# Patient Record
Sex: Female | Born: 1985 | Race: Black or African American | Hispanic: No | Marital: Single | Smoking: Never smoker
Health system: Southern US, Community
[De-identification: ages and names within clinical notes are randomized; demographics above are authoritative.]

## PROBLEM LIST (undated history)

## (undated) DIAGNOSIS — D219 Benign neoplasm of connective and other soft tissue, unspecified: Secondary | ICD-10-CM

## (undated) DIAGNOSIS — N200 Calculus of kidney: Secondary | ICD-10-CM

## (undated) HISTORY — PX: HERNIA REPAIR: SHX51

---

## 2009-08-09 ENCOUNTER — Ambulatory Visit (HOSPITAL_COMMUNITY): Admission: RE | Admit: 2009-08-09 | Discharge: 2009-08-09 | Payer: Self-pay | Admitting: Obstetrics

## 2009-08-09 IMAGING — US US OB DETAIL+14 WK
1 series · 14 of 28 positions shown · non-contrast
Comparison: none

OBSTETRICAL ULTRASOUND:
 This ultrasound exam was performed in the [HOSPITAL] Ultrasound Department.  The OB US report was generated in the AS system, and faxed to the ordering physician.  This report is also available in [HOSPITAL]?s Access[employer] and in [REDACTED] PACS.

[Series 1: us ob detail +14 wk · 0.30mm/px · 68 acquisitions, 14 frames shown]
[im 3/68]
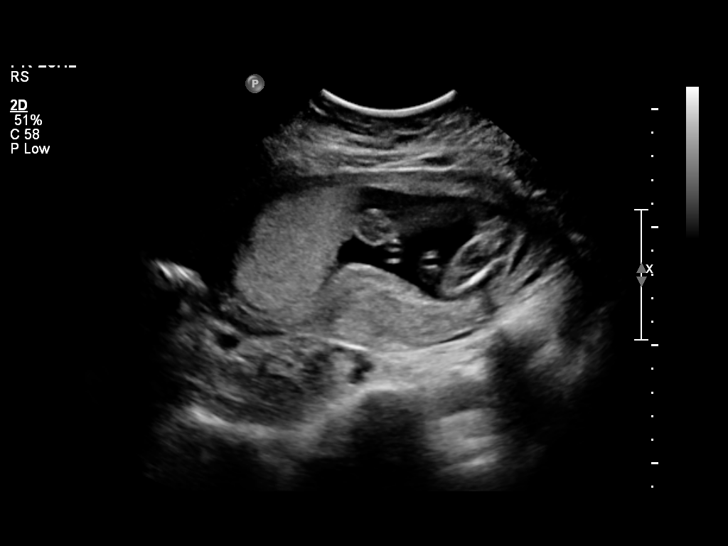
[im 8/68]
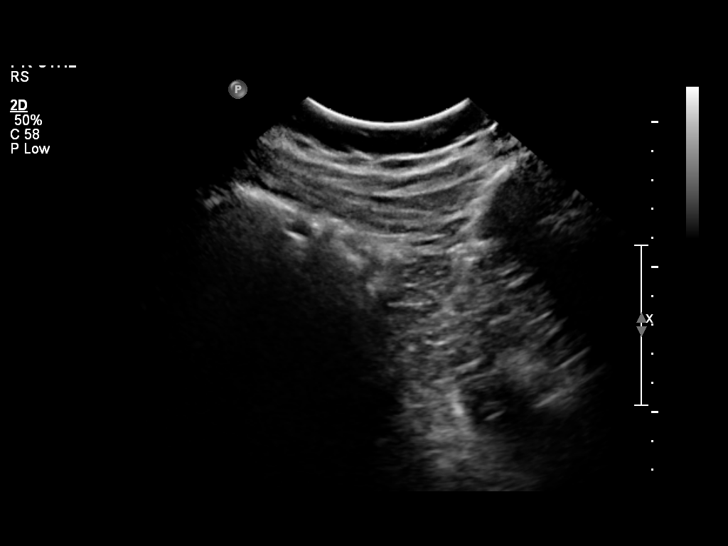
[im 13/68]
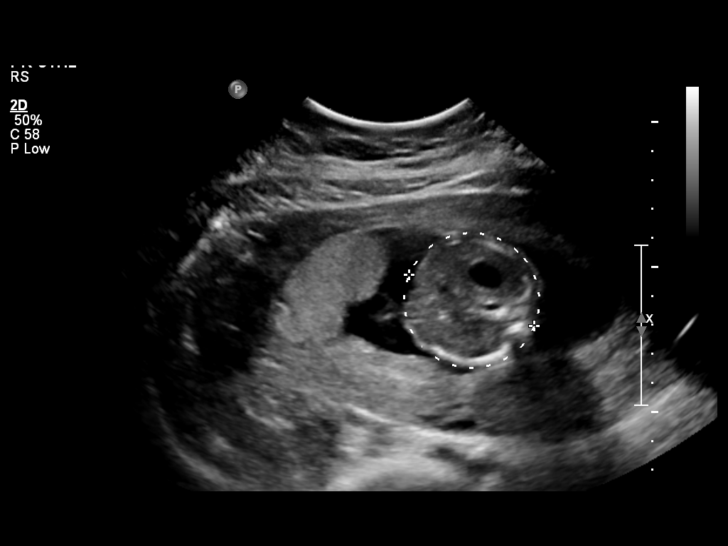
[im 18/68]
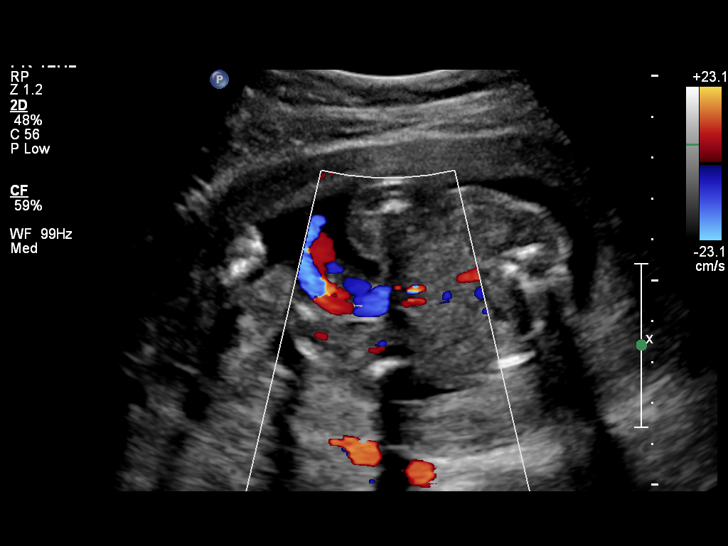
[im 23/68]
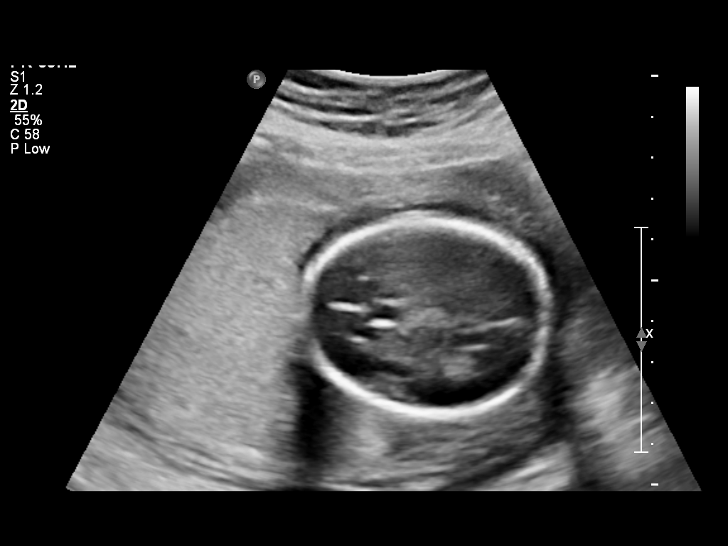
[im 28/68]
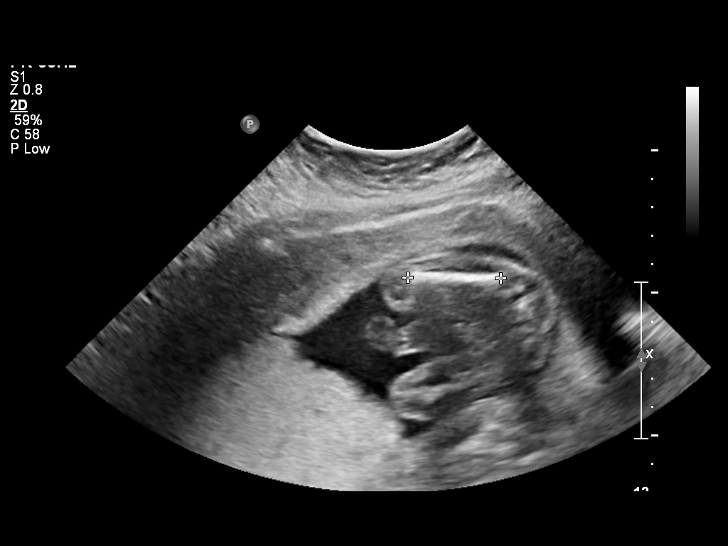
[im 33/68]
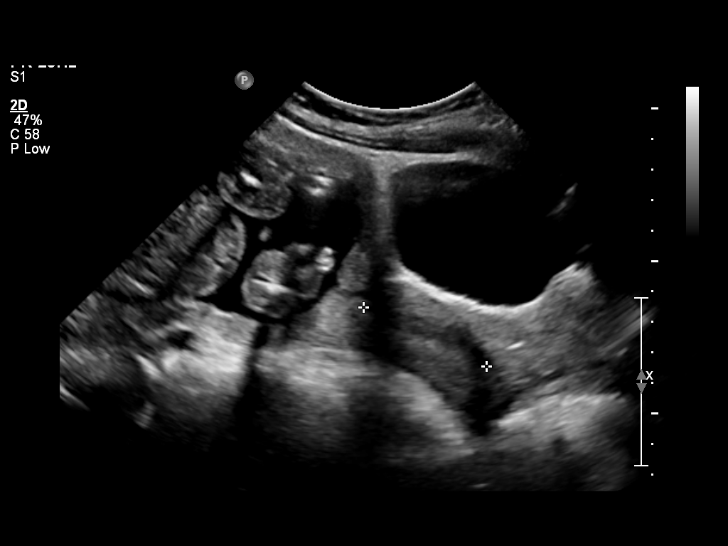
[im 38/68]
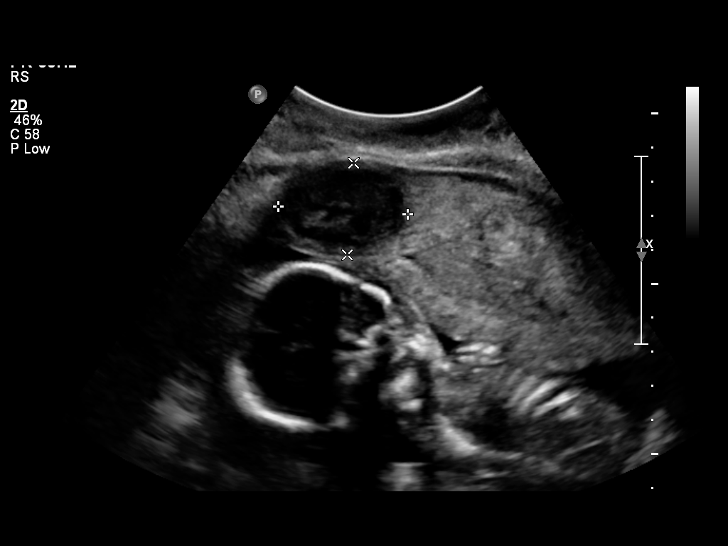
[im 43/68]
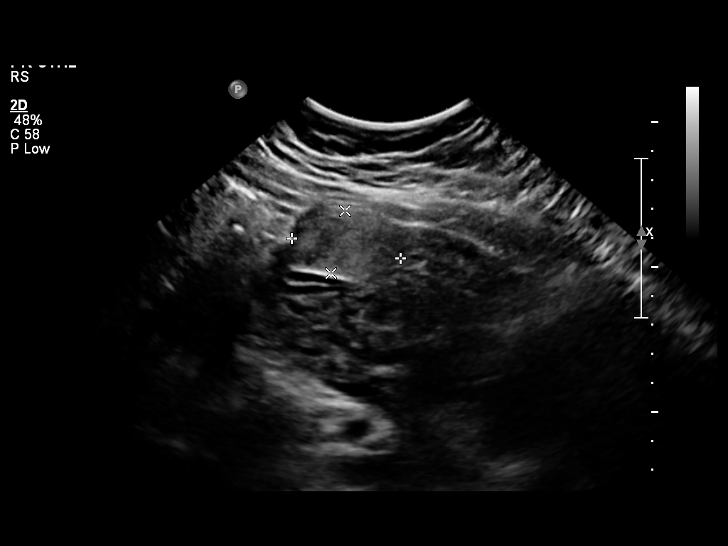
[im 48/68]
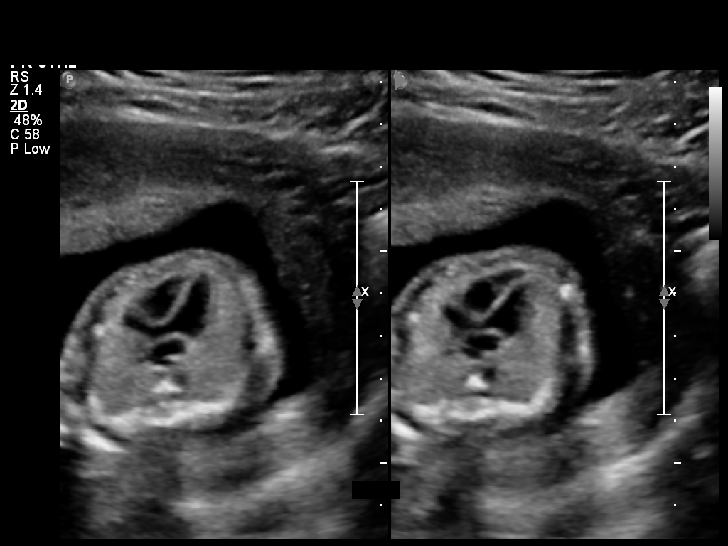
[im 53/68]
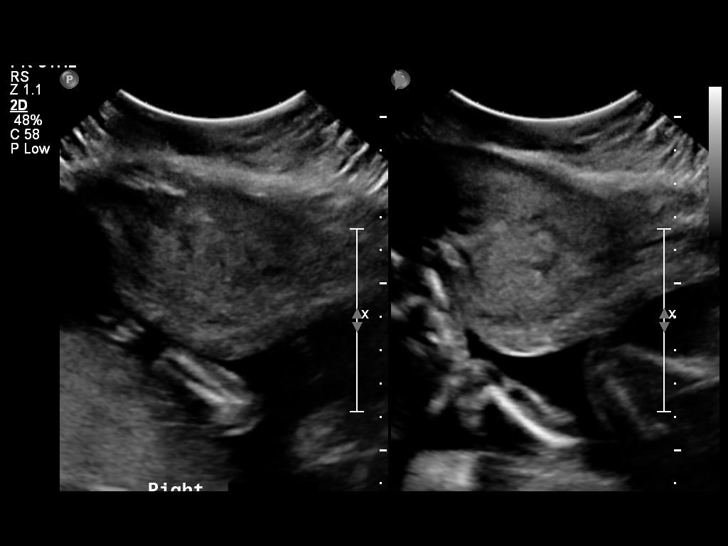
[im 58/68]
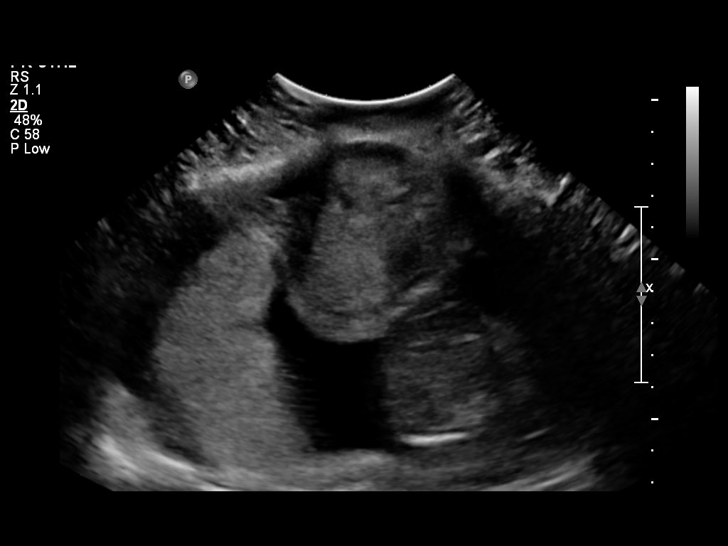
[im 63/68]
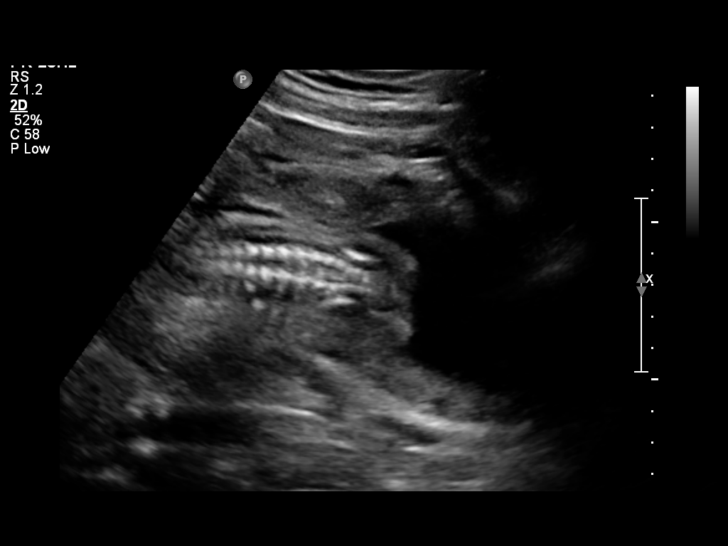
[im 68/68]
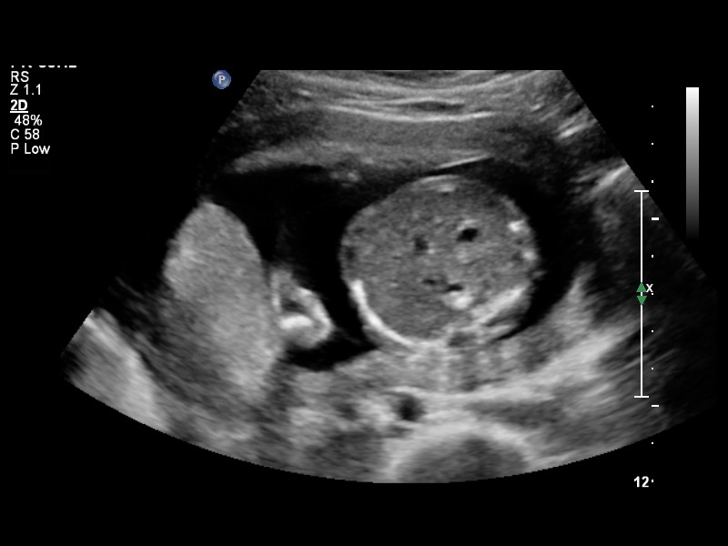

[14 of 28 positions shown; findings below may reference images not displayed]

IMPRESSION: See AS Obstetric US report.

## 2009-10-17 ENCOUNTER — Inpatient Hospital Stay (HOSPITAL_COMMUNITY): Admission: AD | Admit: 2009-10-17 | Discharge: 2009-10-17 | Payer: Self-pay | Admitting: Obstetrics

## 2009-12-08 ENCOUNTER — Ambulatory Visit: Payer: Self-pay | Admitting: Nurse Practitioner

## 2009-12-08 ENCOUNTER — Inpatient Hospital Stay (HOSPITAL_COMMUNITY): Admission: AD | Admit: 2009-12-08 | Discharge: 2009-12-08 | Payer: Self-pay | Admitting: Obstetrics

## 2009-12-13 ENCOUNTER — Encounter: Payer: Self-pay | Admitting: Obstetrics

## 2009-12-13 ENCOUNTER — Inpatient Hospital Stay (HOSPITAL_COMMUNITY): Admission: AD | Admit: 2009-12-13 | Discharge: 2009-12-15 | Payer: Self-pay | Admitting: Obstetrics

## 2010-01-16 ENCOUNTER — Encounter: Admission: RE | Admit: 2010-01-16 | Discharge: 2010-02-15 | Payer: Self-pay | Admitting: Obstetrics

## 2010-02-16 ENCOUNTER — Encounter: Admission: RE | Admit: 2010-02-16 | Discharge: 2010-03-04 | Payer: Self-pay | Admitting: Obstetrics

## 2010-03-19 ENCOUNTER — Encounter: Admission: RE | Admit: 2010-03-19 | Discharge: 2010-04-18 | Payer: Self-pay | Admitting: Obstetrics

## 2010-08-31 LAB — URIC ACID
Uric Acid, Serum: 6.2 mg/dL (ref 2.4–7.0)
Uric Acid, Serum: 5.1 mg/dL (ref 2.4–7.0)

## 2010-08-31 LAB — LACTATE DEHYDROGENASE
LDH: 177 U/L (ref 94–250)
LDH: 162 U/L (ref 94–250)

## 2010-08-31 LAB — CBC
Hemoglobin: 13.9 g/dL (ref 12.0–15.0)
MCH: 33.2 pg (ref 26.0–34.0)
MCH: 33.9 pg (ref 26.0–34.0)
MCH: 33.4 pg (ref 26.0–34.0)
Platelets: 146 10*3/uL — ABNORMAL LOW (ref 150–400)
Platelets: 157 10*3/uL (ref 150–400)
RBC: 4.19 MIL/uL (ref 3.87–5.11)
RBC: 4.13 MIL/uL (ref 3.87–5.11)
RDW: 12.8 % (ref 11.5–15.5)
RDW: 12.9 % (ref 11.5–15.5)
WBC: 10.6 10*3/uL — ABNORMAL HIGH (ref 4.0–10.5)

## 2010-08-31 LAB — COMPREHENSIVE METABOLIC PANEL
ALT: 18 U/L (ref 0–35)
AST: 23 U/L (ref 0–37)
Alkaline Phosphatase: 211 U/L — ABNORMAL HIGH (ref 39–117)
Alkaline Phosphatase: 202 U/L — ABNORMAL HIGH (ref 39–117)
CO2: 20 mEq/L (ref 19–32)
CO2: 24 mEq/L (ref 19–32)
Calcium: 9.2 mg/dL (ref 8.4–10.5)
Chloride: 107 mEq/L (ref 96–112)
GFR calc Af Amer: >60 mL/min (ref >60)
GFR calc non Af Amer: >60 mL/min (ref >60)
GFR calc non Af Amer: >60 mL/min (ref >60)
Glucose, Bld: 84 mg/dL (ref 70–99)
Potassium: 3.8 mEq/L (ref 3.5–5.1)
Potassium: 4 mEq/L (ref 3.5–5.1)
Total Bilirubin: 0.6 mg/dL (ref 0.3–1.2)
Total Bilirubin: 0.4 mg/dL (ref 0.3–1.2)

## 2010-08-31 LAB — URINALYSIS, ROUTINE W REFLEX MICROSCOPIC
Bilirubin Urine: NEGATIVE
Nitrite: NEGATIVE
Specific Gravity, Urine: >1.03 — ABNORMAL HIGH (ref 1.005–1.030)

## 2010-08-31 LAB — RPR: RPR Ser Ql: NONREACTIVE

## 2010-08-31 LAB — URINE MICROSCOPIC-ADD ON

## 2010-09-02 LAB — URINALYSIS, ROUTINE W REFLEX MICROSCOPIC
Hgb urine dipstick: NEGATIVE
Ketones, ur: NEGATIVE mg/dL
Specific Gravity, Urine: 1.02 (ref 1.005–1.030)

## 2010-09-02 LAB — WET PREP, GENITAL
Clue Cells Wet Prep HPF POC: NONE SEEN
Trich, Wet Prep: NONE SEEN
Yeast Wet Prep HPF POC: NONE SEEN

## 2011-11-27 ENCOUNTER — Encounter (INDEPENDENT_AMBULATORY_CARE_PROVIDER_SITE_OTHER): Payer: Self-pay | Admitting: Surgery

## 2011-12-03 ENCOUNTER — Ambulatory Visit (INDEPENDENT_AMBULATORY_CARE_PROVIDER_SITE_OTHER): Payer: BC Managed Care – PPO | Admitting: Surgery

## 2011-12-03 ENCOUNTER — Encounter (INDEPENDENT_AMBULATORY_CARE_PROVIDER_SITE_OTHER): Payer: Self-pay | Admitting: Surgery

## 2011-12-03 VITALS — BP 118/76 | HR 68 | Temp 97.6°F | Resp 16 | Ht 60.0 in | Wt 162.2 lb

## 2011-12-03 DIAGNOSIS — K429 Umbilical hernia without obstruction or gangrene: Secondary | ICD-10-CM

## 2011-12-03 NOTE — Progress Notes (Signed)
Patient ID: Carrie Campos, female   DOB: [birth year], 26 y.o.   MRN: [id number]  Chief Complaint  Patient presents with  . Umbilical Hernia    Evaluate umbilical hernia    HPI Carrie Campos is a 26 y.o. female.  Referred by Dr. Henderson Cloud for evaluation of supraumbilical hernia HPI This is a 26 year old female who presents with a history of her last several months of a palpable mass above her umbilicus. This has enlarged slightly. It causes some discomfort as well as some nausea. This area has never gone away completely. She was evaluated by Dr. Henderson Cloud who felt that she might have a supraumbilical hernia. She is now referred for surgical evaluation. No past medical history on file.  Past Surgical History  Procedure Date  . Hernia repair     hernia repair as a baby    No family history on file.  Social History History  Substance Use Topics  . Smoking status: Never Smoker   . Smokeless tobacco: Not on file  . Alcohol Use: No    No Known Allergies  Current Outpatient Prescriptions  Medication Sig Dispense Refill  . Bismuth Subsalicylate (PEPTO-BISMOL PO) Take by mouth.      . naproxen sodium (ANAPROX) 220 MG tablet Take 220 mg by mouth 2 (two) times daily with a meal.        Review of Systems Review of Systems  Constitutional: Negative for fever, chills and unexpected weight change.  HENT: Negative for hearing loss, congestion, sore throat, trouble swallowing and voice change.   Eyes: Negative for visual disturbance.  Respiratory: Negative for cough and wheezing.   Cardiovascular: Negative for chest pain, palpitations and leg swelling.  Gastrointestinal: Positive for nausea and abdominal pain. Negative for vomiting, diarrhea, constipation, blood in stool, abdominal distention and anal bleeding.  Genitourinary: Negative for hematuria, vaginal bleeding and difficulty urinating.  Musculoskeletal: Negative for arthralgias.  Skin: Negative for rash and wound.  Neurological:  Negative for seizures, syncope and headaches.  Hematological: Negative for adenopathy. Does not bruise/bleed easily.  Psychiatric/Behavioral: Negative for confusion.    Blood pressure 118/76, pulse 68, temperature 97.6 F (36.4 C), temperature source Temporal, resp. rate 16, height 5' (1.524 m), weight 162 lb 3.2 oz (73.573 kg).  Physical Exam Physical Exam WDWN in NAD HEENT:  EOMI, sclera anicteric Neck:  No masses, no thyromegaly Lungs:  CTA bilaterally; normal respiratory effort CV:  Regular rate and rhythm; no murmurs Abd:  +bowel sounds, soft, palpable 4 cm mass above the umbilicus; enlarges with Valsalva; does not completely reduce Ext:  Well-perfused; no edema Skin:  Warm, dry; no sign of jaundice  Data Reviewed None  Assessment    Supraumbilical hernia    Plan    Recommend umbilical hernia repair with mesh.  The surgical procedure has been discussed with the patient.  Potential risks, benefits, alternative treatments, and expected outcomes have been explained.  All of the patient's questions at this time have been answered.  The likelihood of reaching the patient's treatment goal is good.  The patient understand the proposed surgical procedure and wishes to proceed.She is quite nervous about having surgery, but I reassured her that this was necessary to avoid incarceration/ strangulation/ enlarging hernia which might require a larger surgery.        Jake Goodson K. 12/03/2011, 10:25 AM

## 2011-12-08 ENCOUNTER — Telehealth (INDEPENDENT_AMBULATORY_CARE_PROVIDER_SITE_OTHER): Payer: Self-pay | Admitting: General Surgery

## 2011-12-08 NOTE — Telephone Encounter (Signed)
Pt calling to ask if surgery will go forward if she is having her period that day.  Reassured her that it will, but to alert the nursing staff of it when she checks in that day.  She understands.

## 2011-12-24 ENCOUNTER — Encounter (HOSPITAL_COMMUNITY): Payer: Self-pay | Admitting: *Deleted

## 2012-01-01 ENCOUNTER — Encounter (HOSPITAL_COMMUNITY): Payer: Self-pay

## 2012-01-04 ENCOUNTER — Encounter (HOSPITAL_COMMUNITY): Payer: Self-pay | Admitting: Anesthesiology

## 2012-01-04 ENCOUNTER — Ambulatory Visit (HOSPITAL_COMMUNITY): Payer: BC Managed Care – PPO | Admitting: Anesthesiology

## 2012-01-04 ENCOUNTER — Encounter (HOSPITAL_COMMUNITY)
Admission: RE | Disposition: A | Discharge status: Home / Self Care | Payer: Self-pay | Source: Ambulatory Visit | Attending: Surgery

## 2012-01-04 ENCOUNTER — Ambulatory Visit (HOSPITAL_COMMUNITY)
Admission: RE | Admit: 2012-01-04 | Discharge: 2012-01-04 | Disposition: A | Discharge status: Home / Self Care | Payer: BC Managed Care – PPO | Source: Ambulatory Visit | Attending: Surgery | Admitting: Surgery

## 2012-01-04 ENCOUNTER — Encounter (HOSPITAL_COMMUNITY): Payer: Self-pay | Admitting: *Deleted

## 2012-01-04 DIAGNOSIS — Z791 Long term (current) use of non-steroidal anti-inflammatories (NSAID): Secondary | ICD-10-CM | POA: Insufficient documentation

## 2012-01-04 DIAGNOSIS — K429 Umbilical hernia without obstruction or gangrene: Secondary | ICD-10-CM

## 2012-01-04 HISTORY — DX: Benign neoplasm of connective and other soft tissue, unspecified: D21.9

## 2012-01-04 HISTORY — PX: UMBILICAL HERNIA REPAIR: SHX196

## 2012-01-04 HISTORY — DX: Calculus of kidney: N20.0

## 2012-01-04 LAB — SURGICAL PCR SCREEN
MRSA, PCR: POSITIVE — AB
Staphylococcus aureus: POSITIVE — AB

## 2012-01-04 LAB — CBC
HCT: 36.6 % (ref 36.0–46.0)
Hemoglobin: 12.2 g/dL (ref 12.0–15.0)
MCV: 89.1 fL (ref 78.0–100.0)
RDW: 12.4 % (ref 11.5–15.5)
WBC: 5.5 10*3/uL (ref 4.0–10.5)

## 2012-01-04 SURGERY — REPAIR, HERNIA, UMBILICAL, ADULT
Anesthesia: General | Site: Abdomen | Wound class: Clean

## 2012-01-04 MED ORDER — PROMETHAZINE HCL 25 MG/ML IJ SOLN: 6.2500 mg | INTRAMUSCULAR | Status: DC | PRN

## 2012-01-04 MED ORDER — KETOROLAC TROMETHAMINE 30 MG/ML IJ SOLN
30.0000 mg | Freq: Once | INTRAMUSCULAR | Status: AC
  Administered 2012-01-04: 30 mg via INTRAVENOUS

## 2012-01-04 MED ORDER — DEXAMETHASONE SODIUM PHOSPHATE 4 MG/ML IJ SOLN
INTRAMUSCULAR | Status: DC | PRN
  Administered 2012-01-04: 4 mg via INTRAVENOUS

## 2012-01-04 MED ORDER — HYDROMORPHONE HCL PF 1 MG/ML IJ SOLN
0.2500 mg | INTRAMUSCULAR | Status: DC | PRN
  Administered 2012-01-04 (×4): 0.5 mg via INTRAVENOUS

## 2012-01-04 MED ORDER — 0.9 % SODIUM CHLORIDE (POUR BTL) OPTIME
TOPICAL | Status: DC | PRN
  Administered 2012-01-04: 1000 mL

## 2012-01-04 MED ORDER — FENTANYL CITRATE 0.05 MG/ML IJ SOLN
INTRAMUSCULAR | Status: DC | PRN
  Administered 2012-01-04: 25 ug via INTRAVENOUS
  Administered 2012-01-04 (×2): 50 ug via INTRAVENOUS
  Administered 2012-01-04: 25 ug via INTRAVENOUS
  Administered 2012-01-04: 100 ug via INTRAVENOUS

## 2012-01-04 MED ORDER — CHLORHEXIDINE GLUCONATE 4 % EX LIQD
1.0000 "application " | Freq: Once | CUTANEOUS | Status: DC
  Filled 2012-01-04: qty 15

## 2012-01-04 MED ORDER — BUPIVACAINE-EPINEPHRINE PF 0.25-1:200000 % IJ SOLN
INTRAMUSCULAR | Status: AC
  Filled 2012-01-04: qty 30

## 2012-01-04 MED ORDER — METOCLOPRAMIDE HCL 5 MG/ML IJ SOLN
INTRAMUSCULAR | Status: DC | PRN
  Administered 2012-01-04: 10 mg via INTRAVENOUS

## 2012-01-04 MED ORDER — BUPIVACAINE-EPINEPHRINE 0.25% -1:200000 IJ SOLN
INTRAMUSCULAR | Status: DC | PRN
  Administered 2012-01-04: 10 mL

## 2012-01-04 MED ORDER — OXYCODONE-ACETAMINOPHEN 5-325 MG PO TABS: 1.0000 | ORAL_TABLET | ORAL | Status: AC | PRN

## 2012-01-04 MED ORDER — ONDANSETRON HCL 4 MG/2ML IJ SOLN: 4.0000 mg | INTRAMUSCULAR | Status: DC | PRN

## 2012-01-04 MED ORDER — OXYCODONE-ACETAMINOPHEN 5-325 MG PO TABS
ORAL_TABLET | ORAL | Status: AC
  Administered 2012-01-04: 1 via ORAL
  Filled 2012-01-04: qty 1

## 2012-01-04 MED ORDER — MIDAZOLAM HCL 5 MG/5ML IJ SOLN
INTRAMUSCULAR | Status: DC | PRN
  Administered 2012-01-04: 2 mg via INTRAVENOUS

## 2012-01-04 MED ORDER — LACTATED RINGERS IV SOLN
INTRAVENOUS | Status: DC
  Administered 2012-01-04: 09:00:00 via INTRAVENOUS

## 2012-01-04 MED ORDER — MUPIROCIN 2 % EX OINT
TOPICAL_OINTMENT | CUTANEOUS | Status: AC
  Filled 2012-01-04: qty 22

## 2012-01-04 MED ORDER — CEFAZOLIN SODIUM-DEXTROSE 2-3 GM-% IV SOLR
INTRAVENOUS | Status: AC
  Filled 2012-01-04: qty 50

## 2012-01-04 MED ORDER — KETOROLAC TROMETHAMINE 30 MG/ML IJ SOLN
INTRAMUSCULAR | Status: AC
  Filled 2012-01-04: qty 1

## 2012-01-04 MED ORDER — PROPOFOL 10 MG/ML IV BOLUS
INTRAVENOUS | Status: DC | PRN
  Administered 2012-01-04: 200 mg via INTRAVENOUS

## 2012-01-04 MED ORDER — OXYCODONE-ACETAMINOPHEN 5-325 MG PO TABS
1.0000 | ORAL_TABLET | ORAL | Status: DC | PRN
  Administered 2012-01-04: 1 via ORAL

## 2012-01-04 MED ORDER — CEFAZOLIN SODIUM-DEXTROSE 2-3 GM-% IV SOLR
2.0000 g | Freq: Once | INTRAVENOUS | Status: AC
  Administered 2012-01-04: 2 g via INTRAVENOUS

## 2012-01-04 MED ORDER — HYDROMORPHONE HCL PF 1 MG/ML IJ SOLN
INTRAMUSCULAR | Status: AC
  Filled 2012-01-04: qty 1

## 2012-01-04 MED ORDER — MORPHINE SULFATE 10 MG/ML IJ SOLN: 2.0000 mg | INTRAMUSCULAR | Status: DC | PRN

## 2012-01-04 MED ORDER — LIDOCAINE HCL 1 % IJ SOLN
INTRAMUSCULAR | Status: DC | PRN
  Administered 2012-01-04: 30 mg via INTRADERMAL

## 2012-01-04 MED ORDER — FENTANYL CITRATE 0.05 MG/ML IJ SOLN
INTRAMUSCULAR | Status: AC
  Filled 2012-01-04: qty 2

## 2012-01-04 MED ORDER — ONDANSETRON HCL 4 MG/2ML IJ SOLN
INTRAMUSCULAR | Status: DC | PRN
  Administered 2012-01-04: 4 mg via INTRAVENOUS

## 2012-01-04 SURGICAL SUPPLY — 41 items
BENZOIN TINCTURE PRP APPL 2/3 (GAUZE/BANDAGES/DRESSINGS) ×2 IMPLANT
BLADE HEX COATED 2.75 (ELECTRODE) ×2 IMPLANT
BLADE SURG 15 STRL LF DISP TIS (BLADE) ×1 IMPLANT
BLADE SURG 15 STRL SS (BLADE) ×1
CLOTH BEACON ORANGE TIMEOUT ST (SAFETY) ×2 IMPLANT
DECANTER SPIKE VIAL GLASS SM (MISCELLANEOUS) IMPLANT
DRAPE LAPAROTOMY T 102X78X121 (DRAPES) ×2 IMPLANT
DRAPE UTILITY XL STRL (DRAPES) ×2 IMPLANT
DRSG TEGADERM 4X4.75 (GAUZE/BANDAGES/DRESSINGS) IMPLANT
ELECT REM PT RETURN 9FT ADLT (ELECTROSURGICAL) ×2
ELECTRODE REM PT RTRN 9FT ADLT (ELECTROSURGICAL) ×1 IMPLANT
GAUZE SPONGE 4X4 16PLY XRAY LF (GAUZE/BANDAGES/DRESSINGS) ×2 IMPLANT
GLOVE BIO SURGEON STRL SZ7 (GLOVE) ×2 IMPLANT
GLOVE BIOGEL PI IND STRL 7.0 (GLOVE) IMPLANT
GLOVE BIOGEL PI IND STRL 7.5 (GLOVE) ×1 IMPLANT
GLOVE BIOGEL PI INDICATOR 7.0 (GLOVE)
GLOVE BIOGEL PI INDICATOR 7.5 (GLOVE) ×1
GOWN STRL NON-REIN LRG LVL3 (GOWN DISPOSABLE) ×4 IMPLANT
GOWN STRL REIN XL XLG (GOWN DISPOSABLE) ×2 IMPLANT
KIT BASIN OR (CUSTOM PROCEDURE TRAY) ×2 IMPLANT
NEEDLE HYPO 22GX1.5 SAFETY (NEEDLE) ×2 IMPLANT
NEEDLE HYPO 25X1 1.5 SAFETY (NEEDLE) IMPLANT
NS IRRIG 1000ML POUR BTL (IV SOLUTION) ×2 IMPLANT
PACK BASIC VI WITH GOWN DISP (CUSTOM PROCEDURE TRAY) ×2 IMPLANT
PATCH VENTRAL MEDIUM 6.4 (Mesh Specialty) ×2 IMPLANT
PENCIL BUTTON HOLSTER BLD 10FT (ELECTRODE) ×2 IMPLANT
SPONGE GAUZE 4X4 12PLY (GAUZE/BANDAGES/DRESSINGS) ×2 IMPLANT
SPONGE LAP 4X18 X RAY DECT (DISPOSABLE) ×4 IMPLANT
STRIP CLOSURE SKIN 1/2X4 (GAUZE/BANDAGES/DRESSINGS) ×2 IMPLANT
SUT MNCRL AB 4-0 PS2 18 (SUTURE) ×2 IMPLANT
SUT NOVA NAB DX-16 0-1 5-0 T12 (SUTURE) ×2 IMPLANT
SUT NOVA NAB GS-21 0 18 T12 DT (SUTURE) ×4 IMPLANT
SUT PROLENE 0 CT 1 CR/8 (SUTURE) IMPLANT
SUT PROLENE 0 CT 2 (SUTURE) IMPLANT
SUT VIC AB 3-0 SH 27 (SUTURE) ×1
SUT VIC AB 3-0 SH 27X BRD (SUTURE) IMPLANT
SUT VIC AB 3-0 SH 27XBRD (SUTURE) ×1 IMPLANT
SYR CONTROL 10ML LL (SYRINGE) ×2 IMPLANT
TAPE CLOTH SURG 4X10 WHT LF (GAUZE/BANDAGES/DRESSINGS) ×2 IMPLANT
TOWEL OR 17X26 10 PK STRL BLUE (TOWEL DISPOSABLE) ×2 IMPLANT
TOWEL OR NON WOVEN STRL DISP B (DISPOSABLE) ×2 IMPLANT

## 2012-01-04 NOTE — H&P (Signed)
Progress Notes     Patient ID: Carrie Campos, female   DOB: 07/31/1985, 26 y.o.   MRN: [id number]    Chief Complaint   Patient presents with   .  Umbilical Hernia       Evaluate umbilical hernia        HPI Carrie Campos is a 26 y.o. female.  Referred by Dr. Henderson Cloud for evaluation of supraumbilical hernia HPI This is a 26 year old female who presents with a history of her last several months of a palpable mass above her umbilicus. This has enlarged slightly. It causes some discomfort as well as some nausea. This area has never gone away completely. She was evaluated by Dr. Henderson Cloud who felt that she might have a supraumbilical hernia. She is now referred for surgical evaluation. No past medical history on file.    Past Surgical History   Procedure  Date   .  Hernia repair         hernia repair as a baby        No family history on file.   Social History History   Substance Use Topics   .  Smoking status:  Never Smoker    .  Smokeless tobacco:  Not on file   .  Alcohol Use:  No        No Known Allergies    Current Outpatient Prescriptions   Medication  Sig  Dispense  Refill   .  Bismuth Subsalicylate (PEPTO-BISMOL PO)  Take by mouth.         .  naproxen sodium (ANAPROX) 220 MG tablet  Take 220 mg by mouth 2 (two) times daily with a meal.              Review of Systems Review of Systems  Constitutional: Negative for fever, chills and unexpected weight change.  HENT: Negative for hearing loss, congestion, sore throat, trouble swallowing and voice change.   Eyes: Negative for visual disturbance.  Respiratory: Negative for cough and wheezing.   Cardiovascular: Negative for chest pain, palpitations and leg swelling.  Gastrointestinal: Positive for nausea and abdominal pain. Negative for vomiting, diarrhea, constipation, blood in stool, abdominal distention and anal bleeding.  Genitourinary: Negative for hematuria, vaginal bleeding and difficulty urinating.    Musculoskeletal: Negative for arthralgias.  Skin: Negative for rash and wound.  Neurological: Negative for seizures, syncope and headaches.  Hematological: Negative for adenopathy. Does not bruise/bleed easily.  Psychiatric/Behavioral: Negative for confusion.      Blood pressure 118/76, pulse 68, temperature 97.6 F (36.4 C), temperature source Temporal, resp. rate 16, height 5' (1.524 m), weight 162 lb 3.2 oz (73.573 kg).   Physical Exam Physical Exam WDWN in NAD HEENT:  EOMI, sclera anicteric Neck:  No masses, no thyromegaly Lungs:  CTA bilaterally; normal respiratory effort CV:  Regular rate and rhythm; no murmurs Abd:  +bowel sounds, soft, palpable 4 cm mass above the umbilicus; enlarges with Valsalva; does not completely reduce Ext:  Well-perfused; no edema Skin:  Warm, dry; no sign of jaundice   Data Reviewed None   Assessment    Supraumbilical hernia     Plan    Recommend umbilical hernia repair with mesh.  The surgical procedure has been discussed with the patient.  Potential risks, benefits, alternative treatments, and expected outcomes have been explained.  All of the patient's questions at this time have been answered.  The likelihood of reaching the patient's treatment goal is good.  The  patient understand the proposed surgical procedure and wishes to proceed.She is quite nervous about having surgery, but I reassured her that this was necessary to avoid incarceration/ strangulation/ enlarging hernia which might require a larger surgery.        Wilmon Arms. Corliss Skains, MD, [facility]  01/04/2012 7:19 AM

## 2012-01-04 NOTE — Interval H&P Note (Signed)
History and Physical Interval Note:  01/04/2012 7:19 AM  Carrie Campos  has presented today for surgery, with the diagnosis of umbilica hernia  The various methods of treatment have been discussed with the patient and family. After consideration of risks, benefits and other options for treatment, the patient has consented to  Procedure(s) (LRB): HERNIA REPAIR UMBILICAL ADULT (N/A) INSERTION OF MESH (N/A) as a surgical intervention .  The patient's history has been reviewed, patient examined, no change in status, stable for surgery.  I have reviewed the patient's chart and labs.  Questions were answered to the patient's satisfaction.     [name]

## 2012-01-04 NOTE — Anesthesia Preprocedure Evaluation (Signed)
Anesthesia Evaluation  Patient identified by MRN, date of birth, ID band Patient awake    Reviewed: Allergy & Precautions, H&P , NPO status , Patient's Chart, lab work & pertinent test results  Airway Mallampati: II TM Distance: >3 FB Neck ROM: Full    Dental  (+) Teeth Intact and Dental Advisory Given   Pulmonary neg pulmonary ROS,  breath sounds clear to auscultation  Pulmonary exam normal       Cardiovascular negative cardio ROS  Rhythm:Regular Rate:Normal     Neuro/Psych negative neurological ROS  negative psych ROS   GI/Hepatic negative GI ROS, Neg liver ROS,   Endo/Other  negative endocrine ROS  Renal/GU negative Renal ROS  negative genitourinary   Musculoskeletal negative musculoskeletal ROS (+)   Abdominal   Peds  Hematology negative hematology ROS (+)   Anesthesia Other Findings   Reproductive/Obstetrics negative OB ROS                           Anesthesia Physical Anesthesia Plan  ASA: I  Anesthesia Plan: General   Post-op Pain Management:    Induction: Intravenous  Airway Management Planned: Oral ETT  Additional Equipment:   Intra-op Plan:   Post-operative Plan: Extubation in OR  Informed Consent: I have reviewed the patients History and Physical, chart, labs and discussed the procedure including the risks, benefits and alternatives for the proposed anesthesia with the patient or authorized representative who has indicated his/her understanding and acceptance.   Dental advisory given  Plan Discussed with: CRNA  Anesthesia Plan Comments:         Anesthesia Quick Evaluation  

## 2012-01-04 NOTE — Anesthesia Postprocedure Evaluation (Signed)
Anesthesia Post Note  Patient: Carrie Campos  Procedure(s) Performed: Procedure(s) (LRB): HERNIA REPAIR UMBILICAL ADULT (N/A) INSERTION OF MESH (N/A)  Anesthesia type: General  Patient location: PACU  Post pain: Pain level controlled  Post assessment: Post-op Vital signs reviewed  Last Vitals:  Filed Vitals:   01/04/12 0904  BP: 125/60  Pulse: 74  Temp: 37.2 C  Resp: 16    Post vital signs: Reviewed  Level of consciousness: sedated  Complications: No apparent anesthesia complications

## 2012-01-04 NOTE — Op Note (Signed)
Indications:  The patient presented with a history of non-reducible umbilical hernia.  The patient was examined and we recommended umbilical hernia repair with mesh.  Pre-operative diagnosis:  Umbilical hernia  Post-operative diagnosis:  Same  Surgeon: Runell Kovich K.   Assistants: none  Anesthesia: General LMA anesthesia  ASA Class: 2   Procedure Details  The patient was seen again in the Holding Room. The risks, benefits, complications, treatment options, and expected outcomes were discussed with the patient. The possibilities of reaction to medication, pulmonary aspiration, perforation of viscus, bleeding, recurrent infection, the need for additional procedures, and development of a complication requiring transfusion or further operation were discussed with the patient and/or family. There was concurrence with the proposed plan, and informed consent was obtained. The site of surgery was properly noted/marked. The patient was taken to the Operating Room, identified as Carrie Campos, and the procedure verified as umbilical hernia repair. A Time Out was held and the above information confirmed.  After an adequate level of general anesthesia was obtained, the patient's abdomen was prepped with Chloraprep and draped in sterile fashion.  We made a transverse incision above the umbilicus.  Dissection was carried down to the hernia sac with cautery.  We dissected bluntly around the hernia sac down to the edge of the fascial defect.  The hernia sac was opened and contained only omentum.  The hernia sac was excised.  The fascial defect measured 1 cm.  I explored the fascial defect with a finger and palpated two other adjacent fascial defects.  We connected these into a single 3 cm defect.  We cleared the fascia in all directions.  A medium Proceed ventral patch was inserted into the pre-peritoneal space and was deployed.  The mesh was secured with eight trans-fascial sutures of 0 Novofil.  The fascial  defect was closed with multiple interrupted figure-of-eight 1 Novofil sutures.  The base of the umbilicus was tacked down with 3-0 Vicryl.  3-0 Vicryl was used to close the subcutaneous tissues and 4-0 Monocryl was used to close the skin.  Steri-strips and clean dressing were applied.  The patient was extubated and brought to the recovery room in stable condition.  All sponge, instrument, and needle counts were correct prior to closure and at the conclusion of the case.   Estimated Blood Loss: Minimal          Complications: None; patient tolerated the procedure well.         Disposition: PACU - hemodynamically stable.         Condition: stable  Wilmon Arms. Corliss Skains, MD, [facility]  01/04/2012 8:54 AM

## 2012-01-04 NOTE — Transfer of Care (Signed)
Immediate Anesthesia Transfer of Care Note  Patient: Carrie Campos  Procedure(s) Performed: Procedure(s) (LRB): HERNIA REPAIR UMBILICAL ADULT (N/A) INSERTION OF MESH (N/A)  Patient Location: PACU  Anesthesia Type: General  Level of Consciousness: awake, alert , oriented and patient cooperative  Airway & Oxygen Therapy: Patient Spontanous Breathing and Patient connected to face mask oxygen  Post-op Assessment: Report given to PACU RN and Post -op Vital signs reviewed and unstable, Anesthesiologist notified  Post vital signs: Reviewed and stable  Complications: No apparent anesthesia complications

## 2012-01-05 ENCOUNTER — Encounter (HOSPITAL_COMMUNITY): Payer: Self-pay | Admitting: Surgery

## 2012-01-22 ENCOUNTER — Encounter (INDEPENDENT_AMBULATORY_CARE_PROVIDER_SITE_OTHER): Payer: Self-pay | Admitting: Surgery

## 2012-01-22 ENCOUNTER — Ambulatory Visit (INDEPENDENT_AMBULATORY_CARE_PROVIDER_SITE_OTHER): Payer: BC Managed Care – PPO | Admitting: Surgery

## 2012-01-22 VITALS — BP 106/84 | HR 83 | Temp 98.3°F | Resp 16 | Ht 60.0 in | Wt 163.0 lb

## 2012-01-22 DIAGNOSIS — K429 Umbilical hernia without obstruction or gangrene: Secondary | ICD-10-CM

## 2012-01-22 NOTE — Progress Notes (Signed)
Status post umbilical hernia repair with mesh on 01/04/12. The patient had 3 small defects that were combined into one 3 cm defect. This was repaired with a medium proceed ventral patch. She is doing quite well. She still has a little bit of residual soreness. Her incision is well healed and barely visible. No sign of recurrent hernia. No sign of infection or hematoma. She may begin increasing her level of activity over the next few weeks. Resume full activity at the end of August. Followup p.r.n.  Wilmon Arms. Corliss Skains, MD, [facility]  01/22/2012 11:46 AM

## 2012-02-04 ENCOUNTER — Encounter (INDEPENDENT_AMBULATORY_CARE_PROVIDER_SITE_OTHER): Payer: BC Managed Care – PPO | Admitting: Surgery

## 2016-11-04 ENCOUNTER — Emergency Department (HOSPITAL_COMMUNITY)
Admission: EM | Admit: 2016-11-04 | Discharge: 2016-11-05 | Disposition: A | Discharge status: Home / Self Care | Payer: BLUE CROSS/BLUE SHIELD | Attending: Emergency Medicine | Admitting: Emergency Medicine

## 2016-11-04 ENCOUNTER — Encounter (HOSPITAL_COMMUNITY): Payer: Self-pay

## 2016-11-04 DIAGNOSIS — R55 Syncope and collapse: Secondary | ICD-10-CM | POA: Diagnosis not present

## 2016-11-04 DIAGNOSIS — R5383 Other fatigue: Secondary | ICD-10-CM | POA: Diagnosis present

## 2016-11-04 LAB — URINALYSIS, ROUTINE W REFLEX MICROSCOPIC
BILIRUBIN URINE: NEGATIVE
GLUCOSE, UA: NEGATIVE mg/dL
KETONES UR: NEGATIVE mg/dL
LEUKOCYTES UA: NEGATIVE
NITRITE: NEGATIVE
PH: 5 (ref 5.0–8.0)
Protein, ur: NEGATIVE mg/dL
Specific Gravity, Urine: 1.024 (ref 1.005–1.030)

## 2016-11-04 LAB — BASIC METABOLIC PANEL
Anion gap: 8 (ref 5–15)
BUN: 8 mg/dL (ref 6–20)
CALCIUM: 9.5 mg/dL (ref 8.9–10.3)
CO2: 19 mmol/L — ABNORMAL LOW (ref 22–32)
CREATININE: 0.75 mg/dL (ref 0.44–1.00)
Chloride: 109 mmol/L (ref 101–111)
GFR calc Af Amer: >60 mL/min (ref >60)
Glucose, Bld: 134 mg/dL — ABNORMAL HIGH (ref 65–99)
POTASSIUM: 3.6 mmol/L (ref 3.5–5.1)
SODIUM: 136 mmol/L (ref 135–145)

## 2016-11-04 LAB — CBC
HEMATOCRIT: 38.6 % (ref 36.0–46.0)
Hemoglobin: 12.6 g/dL (ref 12.0–15.0)
MCH: 29 pg (ref 26.0–34.0)
MCHC: 32.6 g/dL (ref 30.0–36.0)
MCV: 88.7 fL (ref 78.0–100.0)
PLATELETS: 224 10*3/uL (ref 150–400)
RBC: 4.35 MIL/uL (ref 3.87–5.11)
RDW: 13.9 % (ref 11.5–15.5)
WBC: 9.3 10*3/uL (ref 4.0–10.5)

## 2016-11-04 LAB — CBG MONITORING, ED: Glucose-Capillary: 121 mg/dL — ABNORMAL HIGH (ref 65–99)

## 2016-11-04 LAB — I-STAT BETA HCG BLOOD, ED (MC, WL, AP ONLY): I-stat hCG, quantitative: <5 m[IU]/mL (ref <5)

## 2016-11-04 MED ORDER — SODIUM CHLORIDE 0.9 % IV BOLUS (SEPSIS)
1000.0000 mL | Freq: Once | INTRAVENOUS | Status: AC
  Administered 2016-11-04: 1000 mL via INTRAVENOUS

## 2016-11-04 NOTE — ED Provider Notes (Signed)
[facility] DEPT Provider Note   CSN: [id number] Arrival date & time: 11/04/16  2012     History   Chief Complaint Chief Complaint  Patient presents with  . Fatigue    HPI Carrie Campos is a 31 y.o. female.  HPI   31 year old female with history of fibroid presenting with generalized weakness. Patient states for the past 1-2 weeks she has been feeling generalized fatigue and "Out of it". The symptom has gotten progressively worse. She denies any associated headache, fever, URI symptoms, chest pain, shortness of breath, productive cough, abdominal pain, back pain, nausea vomiting diarrhea, focal numbness or weakness. She was told that she has low iron early in the year was on iron supplementation. She was taken off iron supplements in April. She decided to resume taking her iron supplement within the past couple weeks but take it sparingly. She does admits having dark stool from taking iron but denies any abnormal bleeding. She admits that she is stressed out from work and having to take care of her kids. She denies fever and depressed, no SI or HI. She denies alcohol or drug abuse. No history of thyroid disease. No other medication changes. She takes Advil on occasion for occasional headache. No headache currently. She does not have a PCP. Her symptoms seems to be indolent. Denies any increased lightheadedness with positional change.  Past Medical History:  Diagnosis Date  . Fibroid   . Kidney stone   . Kidney stone     Patient Active Problem List   Diagnosis Date Noted  . Umbilical hernia 50/53/9767    Past Surgical History:  Procedure Laterality Date  . HERNIA REPAIR     hernia repair as a baby  . UMBILICAL HERNIA REPAIR  01/04/2012   Procedure: HERNIA REPAIR UMBILICAL ADULT;  Surgeon: Imogene Burn. Georgette Dover, MD;  Location: [facility] ORS;  Service: General;  Laterality: N/A;    OB History    No data available       Home Medications    Prior to Admission medications   Not on  File    Family History History reviewed. No pertinent family history.  Social History Social History  Substance Use Topics  . Smoking status: Never Smoker  . Smokeless tobacco: Not on file  . Alcohol use No     Allergies   Patient has no known allergies.   Review of Systems Review of Systems  All other systems reviewed and are negative.    Physical Exam Updated Vital Signs BP 131/78   Pulse (!) 114   Temp 99 F (37.2 C)   Resp (!) 22   Ht 5' (1.524 m)   Wt 81.6 kg (180 lb)   SpO2 100%   BMI 35.15 kg/m   Physical Exam  Constitutional: She is oriented to person, place, and time. She appears well-developed and well-nourished. No distress.  HENT:  Head: Atraumatic.  Eyes: Conjunctivae are normal.  Neck: Normal range of motion. Neck supple.  Cardiovascular: Normal rate, regular rhythm and intact distal pulses.   Pulmonary/Chest: Effort normal and breath sounds normal.  Abdominal: Bowel sounds are normal. She exhibits no distension. There is no tenderness.  Musculoskeletal: She exhibits no edema.  Neurological: She is alert and oriented to person, place, and time. She has normal strength. No cranial nerve deficit or sensory deficit. She displays a negative Romberg sign. Coordination and gait normal. GCS eye subscore is 4. GCS verbal subscore is 5. GCS motor subscore is 6.  Skin:  No rash noted.  Psychiatric: She has a normal mood and affect.  Nursing note and vitals reviewed.    ED Treatments / Results  Labs (all labs ordered are listed, but only abnormal results are displayed) Labs Reviewed  BASIC METABOLIC PANEL - Abnormal; Notable for the following:       Result Value   CO2 19 (*)    Glucose, Bld 134 (*)    All other components within normal limits  URINALYSIS, ROUTINE W REFLEX MICROSCOPIC - Abnormal; Notable for the following:    Hgb urine dipstick SMALL (*)    Bacteria, UA RARE (*)    Squamous Epithelial / LPF 0-5 (*)    All other components within  normal limits  CBG MONITORING, ED - Abnormal; Notable for the following:    Glucose-Capillary 121 (*)    All other components within normal limits  CBC  I-STAT BETA HCG BLOOD, ED ([facility], [facility], [facility] ONLY)    EKG  EKG Interpretation  Date/Time:  Wednesday Nov 04 2016 21:03:47 EDT Ventricular Rate:  102 PR Interval:    QRS Duration: 83 QT Interval:  329 QTC Calculation: 429 R Axis:   67 Text Interpretation:  Sinus tachycardia Otherwise within normal limits No old tracing to compare Confirmed by Delora Fuel ([id number]) on 11/04/2016 10:56:52 PM       Radiology No results found.  Procedures Procedures (including critical care time)  Orthostatic Lying   BP- Lying: 130/80  Pulse- Lying: 100      Orthostatic Sitting  BP- Sitting: 134/90  Pulse- Sitting: 111      Orthostatic Standing at 0 minutes  BP- Standing at 0 minutes: 135/90  Pulse- Standing at 0 minutes: 115     Medications Ordered in ED Medications  sodium chloride 0.9 % bolus 1,000 mL (1,000 mLs Intravenous New Bag/Given 11/04/16 2238)     Initial Impression / Assessment and Plan / ED Course  I have reviewed the triage vital signs and the nursing notes.  Pertinent labs & imaging results that were available during my care of the patient were reviewed by me and considered in my medical decision making (see chart for details).     BP 102/66   Pulse 94   Temp 99 F (37.2 C)   Resp (!) 27   Ht 5' (1.524 m)   Wt 81.6 kg (180 lb)   SpO2 98%   BMI 35.15 kg/m    Final Clinical Impressions(s) / ED Diagnoses   Final diagnoses:  Syncope, near    New Prescriptions New Prescriptions   No medications on file   9:17 PM Patient here with complaints of generalized fatigue. She has no focal neuro deficit on exam. No focal weakness appreciated. She is well-appearing. Workup initiated. IV fluid given.  11:30 PM Normal CBG, pregnancy test is negative, normal electrolytes panel, normal WBC, normal H&H, urine without  signs of urine tract infection, EKG without acute ischemic changes. Orthostatic vital sign shows increase heart rate. Fluid given.  11:41 PM Encourage pt to stay hydrated and also to f/u with PCP for further evaluation of her condition. Return precaution given.     Domenic Moras, PA-C 11/05/16 0001    Julianne Rice, MD 11/05/16 [phone]

## 2016-11-04 NOTE — ED Triage Notes (Signed)
Pt here for general weakness, feeling near syncopal. sts ongoing for a while has taken iron in past but hasnt consistently.

## 2016-11-04 NOTE — ED Notes (Signed)
Carrie Campos at bedside

## 2016-11-04 NOTE — ED Notes (Signed)
Pt c/o weakness with tingling in legs and across head

## 2016-11-04 NOTE — ED Notes (Signed)
Pt ambulated in hall w/o difficultly. O2 stats 98, pulse 94.

## 2016-11-05 NOTE — Discharge Instructions (Signed)
Please follow up with your primary care provider for further evaluation of your condition.  Return if you develop fever, shortness of breath, chest pain or if you have other concerns.

## 2023-11-26 ENCOUNTER — Encounter: Payer: Self-pay | Admitting: Obstetrics and Gynecology

## 2023-11-30 ENCOUNTER — Other Ambulatory Visit: Payer: Self-pay | Admitting: Obstetrics and Gynecology

## 2023-11-30 DIAGNOSIS — D251 Intramural leiomyoma of uterus: Secondary | ICD-10-CM

## 2023-11-30 DIAGNOSIS — N8003 Adenomyosis of the uterus: Secondary | ICD-10-CM

## 2023-12-07 ENCOUNTER — Encounter: Payer: Self-pay | Admitting: Obstetrics and Gynecology

## 2024-01-04 ENCOUNTER — Other Ambulatory Visit

## 2024-01-29 ENCOUNTER — Ambulatory Visit
Admission: RE | Admit: 2024-01-29 | Discharge: 2024-01-29 | Disposition: A | Discharge status: Home / Self Care | Source: Ambulatory Visit | Attending: Obstetrics and Gynecology | Admitting: Obstetrics and Gynecology

## 2024-01-29 DIAGNOSIS — D251 Intramural leiomyoma of uterus: Secondary | ICD-10-CM

## 2024-01-29 DIAGNOSIS — N8003 Adenomyosis of the uterus: Secondary | ICD-10-CM

## 2024-01-29 MED ORDER — GADOPICLENOL 0.5 MMOL/ML IV SOLN
8.0000 mL | Freq: Once | INTRAVENOUS | Status: AC | PRN
  Administered 2024-01-29: 8 mL via INTRAVENOUS

## 2024-07-25 ENCOUNTER — Encounter: Admission: RE | Payer: Self-pay

## 2024-07-25 ENCOUNTER — Ambulatory Visit (HOSPITAL_COMMUNITY): Admission: RE | Admit: 2024-07-25 | Admitting: Obstetrics and Gynecology
